# Patient Record
Sex: Male | Born: 1995 | Race: Asian | Hispanic: No | Marital: Single | Smoking: Current every day smoker
Health system: Southern US, Community
[De-identification: ages and names within clinical notes are randomized; demographics above are authoritative.]

---

## 2015-06-30 ENCOUNTER — Emergency Department (HOSPITAL_COMMUNITY): Payer: Self-pay

## 2015-06-30 ENCOUNTER — Encounter (HOSPITAL_COMMUNITY): Payer: Self-pay | Admitting: Family Medicine

## 2015-06-30 ENCOUNTER — Emergency Department (HOSPITAL_COMMUNITY)
Admission: EM | Admit: 2015-06-30 | Discharge: 2015-06-30 | Disposition: A | Discharge status: Home / Self Care | Payer: Self-pay | Attending: Emergency Medicine | Admitting: Emergency Medicine

## 2015-06-30 DIAGNOSIS — Z720 Tobacco use: Secondary | ICD-10-CM | POA: Insufficient documentation

## 2015-06-30 DIAGNOSIS — J209 Acute bronchitis, unspecified: Secondary | ICD-10-CM | POA: Insufficient documentation

## 2015-06-30 DIAGNOSIS — J40 Bronchitis, not specified as acute or chronic: Secondary | ICD-10-CM

## 2015-06-30 DIAGNOSIS — R000 Tachycardia, unspecified: Secondary | ICD-10-CM | POA: Insufficient documentation

## 2015-06-30 LAB — RAPID STREP SCREEN (MED CTR MEBANE ONLY): Streptococcus, Group A Screen (Direct): NEGATIVE

## 2015-06-30 MED ORDER — ACETAMINOPHEN 325 MG PO TABS
650.0000 mg | ORAL_TABLET | Freq: Once | ORAL | Status: AC | PRN
  Administered 2015-06-30: 650 mg via ORAL

## 2015-06-30 MED ORDER — AZITHROMYCIN 250 MG PO TABS: 250.0000 mg | ORAL_TABLET | Freq: Every day | ORAL | Status: AC

## 2015-06-30 MED ORDER — BENZONATATE 100 MG PO CAPS: 100.0000 mg | ORAL_CAPSULE | Freq: Three times a day (TID) | ORAL | Status: AC

## 2015-06-30 MED ORDER — ACETAMINOPHEN 325 MG PO TABS
ORAL_TABLET | ORAL | Status: AC
  Filled 2015-06-30: qty 2

## 2015-06-30 NOTE — ED Provider Notes (Signed)
CSN: [id number]     Arrival date & time 06/30/15  1528 History  This chart was scribed for Kerrie Buffalo, NP working with Gerhard Munch, MD by Evon Slack, ED Scribe. This patient was seen in room TR02C/TR02C and the patient's care was started at 4:17 PM.      Chief Complaint  Patient presents with  . Fever  . Sore Throat  . Cough   Patient is a 19 y.o. male presenting with cough. The history is provided by the patient. No language interpreter was used.  Cough Cough characteristics:  Productive Sputum characteristics:  Manson Passey Severity:  Moderate Onset quality:  Gradual Duration:  4 days Timing:  Intermittent Chronicity:  New Smoker: yes   Relieved by:  None tried Worsened by:  Nothing tried Ineffective treatments:  None tried Associated symptoms: chills, fever and sore throat    HPI Comments: Bela Nai Rodin is a 19 y.o. male who presents to the Emergency Department complaining of sore throat onset 4 days prior. Pt states that he has had associated congestion, fever, chills and productive cough of brown sputum. Pt also reports post tussive vomiting. Pt does report that he has been constipated the past week as well. Pt doesn't report any alleviating or worsening factors. Pt doesn't report nay medications PTA.  Pt does report smoking 4-5 cigarettes daily. Pt denies nausea or abdominal pain.    History reviewed. No pertinent past medical history. History reviewed. No pertinent past surgical history. History reviewed. No pertinent family history. Social History  Substance Use Topics  . Smoking status: Current Every Day Smoker  . Smokeless tobacco: None  . Alcohol Use: None    Review of Systems  Constitutional: Positive for fever and chills.  HENT: Positive for congestion and sore throat.   Respiratory: Positive for cough.   All other systems reviewed and are negative.    Allergies  Review of patient's allergies indicates no known allergies.  Home Medications   Prior to  Admission medications   Medication Sig Start Date End Date Taking? Authorizing Provider  azithromycin (ZITHROMAX) 250 MG tablet Take 1 tablet (250 mg total) by mouth daily. Take first 2 tablets together, then 1 every day until finished. 06/30/15   Hope Orlene Och, NP  benzonatate (TESSALON) 100 MG capsule Take 1 capsule (100 mg total) by mouth every 8 (eight) hours. 06/30/15   Hope Orlene Och, NP   BP 103/61 mmHg  Pulse 103  Temp(Src) 100 F (37.8 C) (Oral)  Resp 20  Ht  (1.651 m)  Wt 119 lb 5 oz (54.12 kg)  BMI 19.85 kg/m2  SpO2 100%   Physical Exam  Constitutional: He is oriented to person, place, and time. He appears well-developed and well-nourished. No distress.  HENT:  Head: Normocephalic and atraumatic.  Mouth/Throat: Uvula is midline. Posterior oropharyngeal erythema (mild) present. No posterior oropharyngeal edema.  Eyes: Conjunctivae and EOM are normal. Pupils are equal, round, and reactive to light.  Neck: Normal range of motion. Neck supple. No tracheal deviation present.  Cardiovascular: Regular rhythm.  Tachycardia present.   Pulmonary/Chest: Effort normal. No respiratory distress. He has no decreased breath sounds. He has no wheezes. He has rales in the right lower field.  Abdominal: There is no tenderness.  Musculoskeletal: Normal range of motion.  Lymphadenopathy:    He has no cervical adenopathy.  Neurological: He is alert and oriented to person, place, and time.  Skin: Skin is warm and dry.  Psychiatric: He has a normal mood  and affect. His behavior is normal.  Nursing note and vitals reviewed.   ED Course  Procedures (including critical care time) DIAGNOSTIC STUDIES: Oxygen Saturation is 100% on RA, normal by my interpretation.    COORDINATION OF CARE: 4:50 PM-Discussed treatment plan with pt at bedside and pt agreed to plan.     Labs Review Results for orders placed or performed during the hospital encounter of 06/30/15 (from the past 24 hour(s))  Rapid  strep screen (not at [facility])     Status: None   Collection Time: 06/30/15  4:02 PM  Result Value Ref Range   Streptococcus, Group A Screen (Direct) NEGATIVE NEGATIVE     Imaging Review Dg Chest 2 View  06/30/2015   CLINICAL DATA:  Cough and fever for 4 days.  Smoker.  EXAM: CHEST  2 VIEW  COMPARISON:  None.  FINDINGS: The lungs are clear. Heart size is normal. No pneumothorax or pleural fluid. No focal bony abnormality.  IMPRESSION: Negative chest.   Electronically Signed   By: Drusilla Kanner M.D.   On: 06/30/2015 17:37   I have personally reviewed and evaluated the lab results as part of my medical decision-making.   MDM  19 y.o. male with productive cough, sore throat, and is an everyday smoker. Stable for d/c without respiratory distress. O2 SAT 100% on R/A. Will treat with Z-Pak and Tessalon and encourage patient to stop smoking. Discussed with the patient and all questioned fully answered. He will return if any problems arise.   Final diagnoses:  Bronchitis    I personally performed the services described in this documentation, which was scribed in my presence. The recorded information has been reviewed and is accurate.      [address] [place], NP 06/30/15 1752  Gerhard Munch, MD 07/03/15 [phone]

## 2015-06-30 NOTE — ED Notes (Signed)
Pt here for sore throat, fever, and cough x a few days.

## 2015-07-02 ENCOUNTER — Emergency Department (HOSPITAL_COMMUNITY)
Admission: EM | Admit: 2015-07-02 | Discharge: 2015-07-02 | Disposition: A | Discharge status: Home / Self Care | Payer: Self-pay | Attending: Emergency Medicine | Admitting: Emergency Medicine

## 2015-07-02 ENCOUNTER — Encounter (HOSPITAL_COMMUNITY): Payer: Self-pay | Admitting: *Deleted

## 2015-07-02 DIAGNOSIS — J209 Acute bronchitis, unspecified: Secondary | ICD-10-CM

## 2015-07-02 DIAGNOSIS — R000 Tachycardia, unspecified: Secondary | ICD-10-CM | POA: Insufficient documentation

## 2015-07-02 DIAGNOSIS — J9801 Acute bronchospasm: Secondary | ICD-10-CM | POA: Insufficient documentation

## 2015-07-02 DIAGNOSIS — Z720 Tobacco use: Secondary | ICD-10-CM | POA: Insufficient documentation

## 2015-07-02 LAB — CULTURE, GROUP A STREP: Strep A Culture: NEGATIVE

## 2015-07-02 MED ORDER — IPRATROPIUM-ALBUTEROL 0.5-2.5 (3) MG/3ML IN SOLN
3.0000 mL | Freq: Once | RESPIRATORY_TRACT | Status: AC
  Administered 2015-07-02: 3 mL via RESPIRATORY_TRACT
  Filled 2015-07-02: qty 3

## 2015-07-02 MED ORDER — ALBUTEROL SULFATE HFA 108 (90 BASE) MCG/ACT IN AERS: 2.0000 | INHALATION_SPRAY | RESPIRATORY_TRACT | Status: DC | PRN

## 2015-07-02 MED ORDER — PREDNISONE 10 MG PO TABS: 20.0000 mg | ORAL_TABLET | Freq: Two times a day (BID) | ORAL | Status: AC

## 2015-07-02 NOTE — Discharge Instructions (Signed)
Cool Mist Vaporizers °Vaporizers may help relieve the symptoms of a cough and cold. They add moisture to the air, which helps mucus to become thinner and less sticky. This makes it easier to breathe and cough up secretions. Cool mist vaporizers do not cause serious burns like hot mist vaporizers, which may also be called steamers or humidifiers. Vaporizers have not been proven to help with colds. You should not use a vaporizer if you are allergic to mold. °HOME CARE INSTRUCTIONS °· Follow the package instructions for the vaporizer. °· Do not use anything other than distilled water in the vaporizer. °· Do not run the vaporizer all of the time. This can cause mold or bacteria to grow in the vaporizer. °· Clean the vaporizer after each time it is used. °· Clean and dry the vaporizer well before storing it. °· Stop using the vaporizer if worsening respiratory symptoms develop. °Document Released: 07/06/2004 Document Revised: 10/14/2013 Document Reviewed: 02/26/2013 °Exit[employer]® Patient Information ©2015 [employer]. This information is not intended to replace advice given to you by your health care provider. Make sure you discuss any questions you have with your health care provider. ° °

## 2015-07-02 NOTE — ED Provider Notes (Signed)
CSN: [id number]     Arrival date & time 07/02/15  1759 History  This chart was scribed for non-physician practitioner Kerrie Buffalo, NP working with Tilden Fossa, MD by Leone Payor, ED Scribe. This patient was seen in room TR05C/TR05C and the patient's care was started at 6:13 PM.    Chief Complaint  Patient presents with  . Letter for School/Work    The history is provided by the patient. No language interpreter was used.     HPI Comments: Mike Navarro is a 19 y.o. male who presents to the Emergency Department complaining of continued fatigued and cough for the past 2 days. He was seen on 06/30/15 when he was diagnosed with bronchitis and started on a Z-pak and Tessalon pearls. He reports feeling slightly better but continues to have a cough and feels chest tightness. He is also requesting a note for work because he states he is unable to get any rest while sleeping. He denies fever, chills.  Normal chest x-ray from previous visit.   History reviewed. No pertinent past medical history. History reviewed. No pertinent past surgical history. History reviewed. No pertinent family history. Social History  Substance Use Topics  . Smoking status: Current Every Day Smoker  . Smokeless tobacco: None  . Alcohol Use: No    Review of Systems  Constitutional: Negative for fever and chills.  Respiratory: Positive for cough and chest tightness.   all other systems negative    Allergies  Review of patient's allergies indicates no known allergies.  Home Medications   Prior to Admission medications   Medication Sig Start Date End Date Taking? Authorizing Provider  azithromycin (ZITHROMAX) 250 MG tablet Take 1 tablet (250 mg total) by mouth daily. Take first 2 tablets together, then 1 every day until finished. 06/30/15   Ravonda Brecheen Orlene Och, NP  benzonatate (TESSALON) 100 MG capsule Take 1 capsule (100 mg total) by mouth every 8 (eight) hours. 06/30/15   Kason Benak Orlene Och, NP  predniSONE (DELTASONE) 10 MG tablet Take  2 tablets (20 mg total) by mouth 2 (two) times daily with a meal. 07/02/15   Anmol Fleck Orlene Och, NP   BP 120/66 mmHg  Pulse 97  Temp(Src) 98.9 F (37.2 C) (Oral)  Resp 18  SpO2 100% Physical Exam  Constitutional: He is oriented to person, place, and time. He appears well-developed and well-nourished.  HENT:  Head: Normocephalic and atraumatic.  Mouth/Throat: Oropharynx is clear and moist. No oropharyngeal exudate.  Eyes: Conjunctivae are normal. Pupils are equal, round, and reactive to light.  Cardiovascular: Regular rhythm and normal heart sounds.  Tachycardia present.   Pulmonary/Chest: Effort normal. No respiratory distress. He has wheezes. He has no rales.  Slightly decreased breath sounds and expiratory wheezes bilaterally.   Abdominal: He exhibits no distension.  Neurological: He is alert and oriented to person, place, and time.  Skin: Skin is warm and dry.  Psychiatric: He has a normal mood and affect.  Nursing note and vitals reviewed.   ED Course  Procedures (including critical care time) Albuterol/Atrovent Neb treatment  DIAGNOSTIC STUDIES: Oxygen Saturation is 100% on RA, normal by my interpretation.    COORDINATION OF CARE: 6:15 PM Discussed treatment plan with pt at bedside and pt agreed to plan.  7:20 PM Improved breathing with Duoneb with an occasional wheeze. Will discharge home with an inhaler.     MDM  19 y.o. male returns to the ED for a work note and reports feeling of wheezing since previous  visit. Will treat for bronchitis with short course of steroid and albuterol inhaler. Discussed with the patient plan f care and all questioned fully answered. He will call me if any problems arise.  Final diagnoses:  Bronchitis with bronchospasm   I personally performed the services described in this documentation, which was scribed in my presence. The recorded information has been reviewed and is accurate.    [address] [place], NP 07/02/15 2324  Tilden Fossa, MD 07/03/15  [phone]

## 2015-07-02 NOTE — ED Notes (Signed)
Pt was seen on 9/7 for bronchitis and still has fatigue and cough, unable to work and needs a work note. No distress noted.

## 2016-07-25 IMAGING — CR DG CHEST 2V
2 series · 2 of 2 positions shown · non-contrast
Comparison: None.

CLINICAL DATA: Cough and fever for 4 days.  Smoker.

EXAM:
CHEST  2 VIEW

[chest pa]
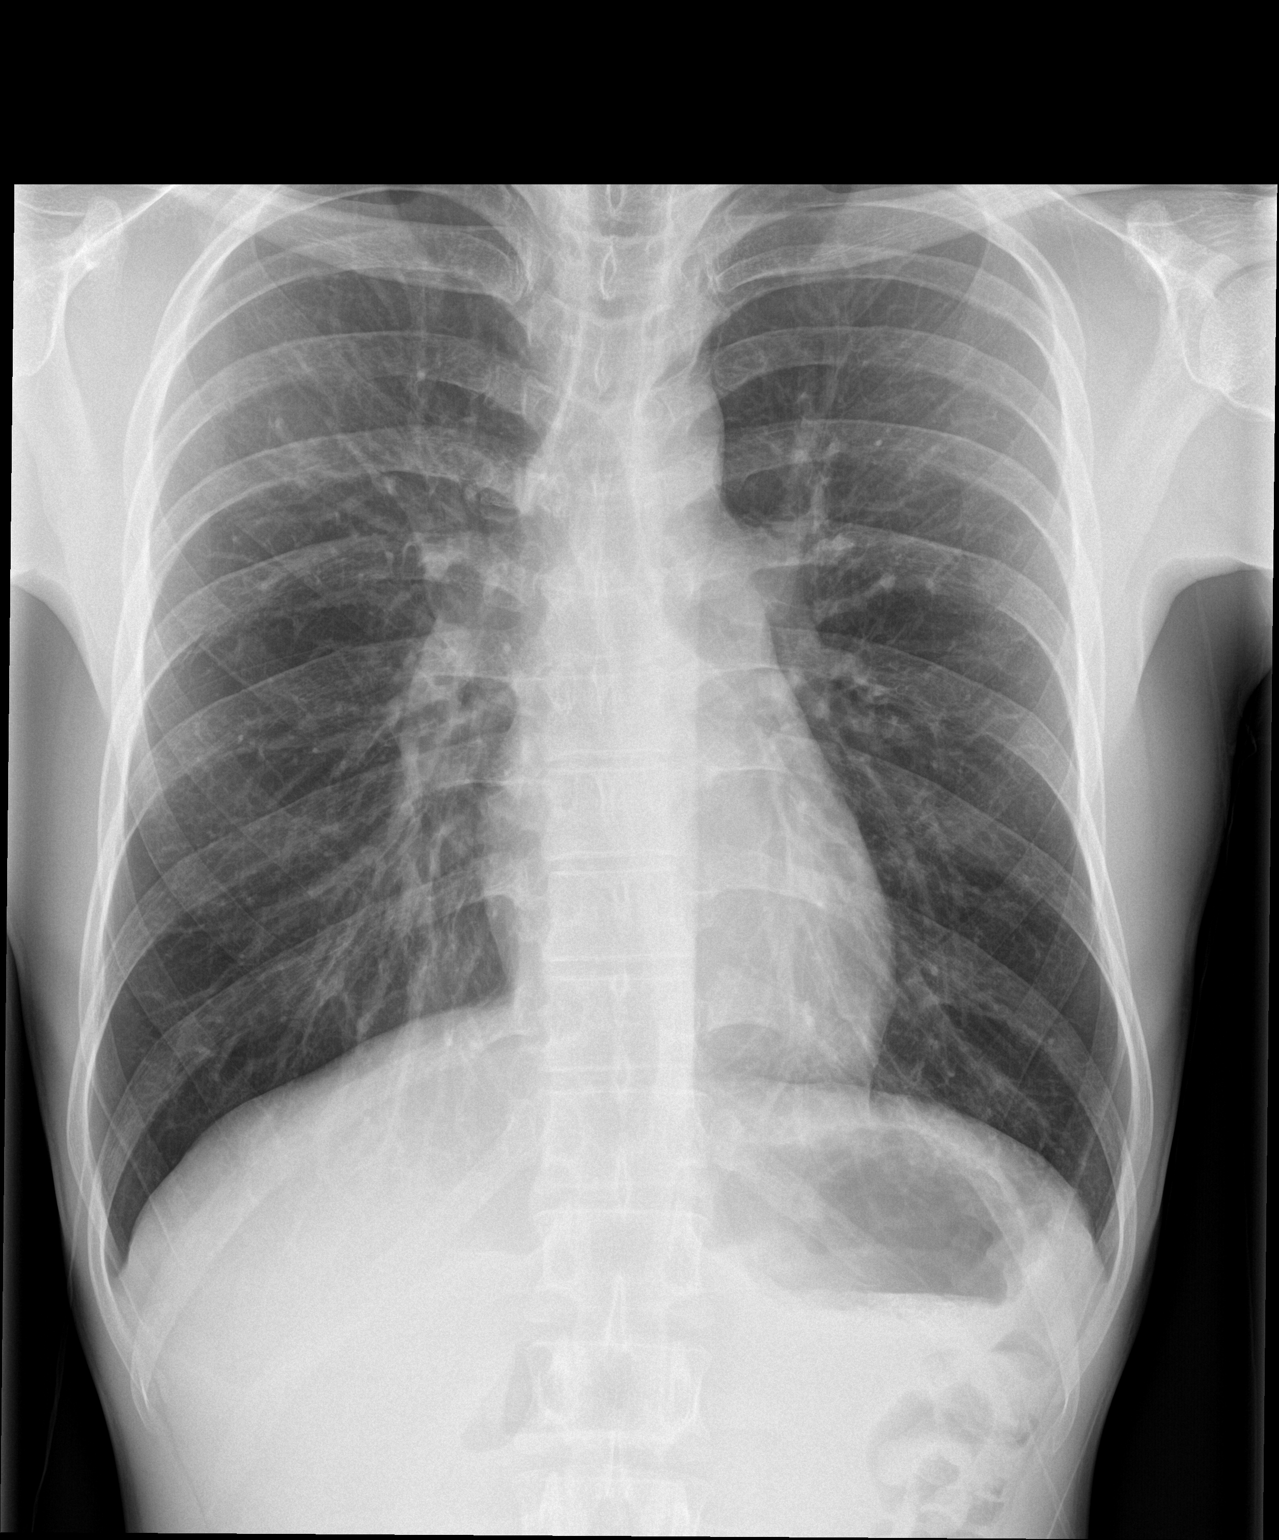

[chest lat]
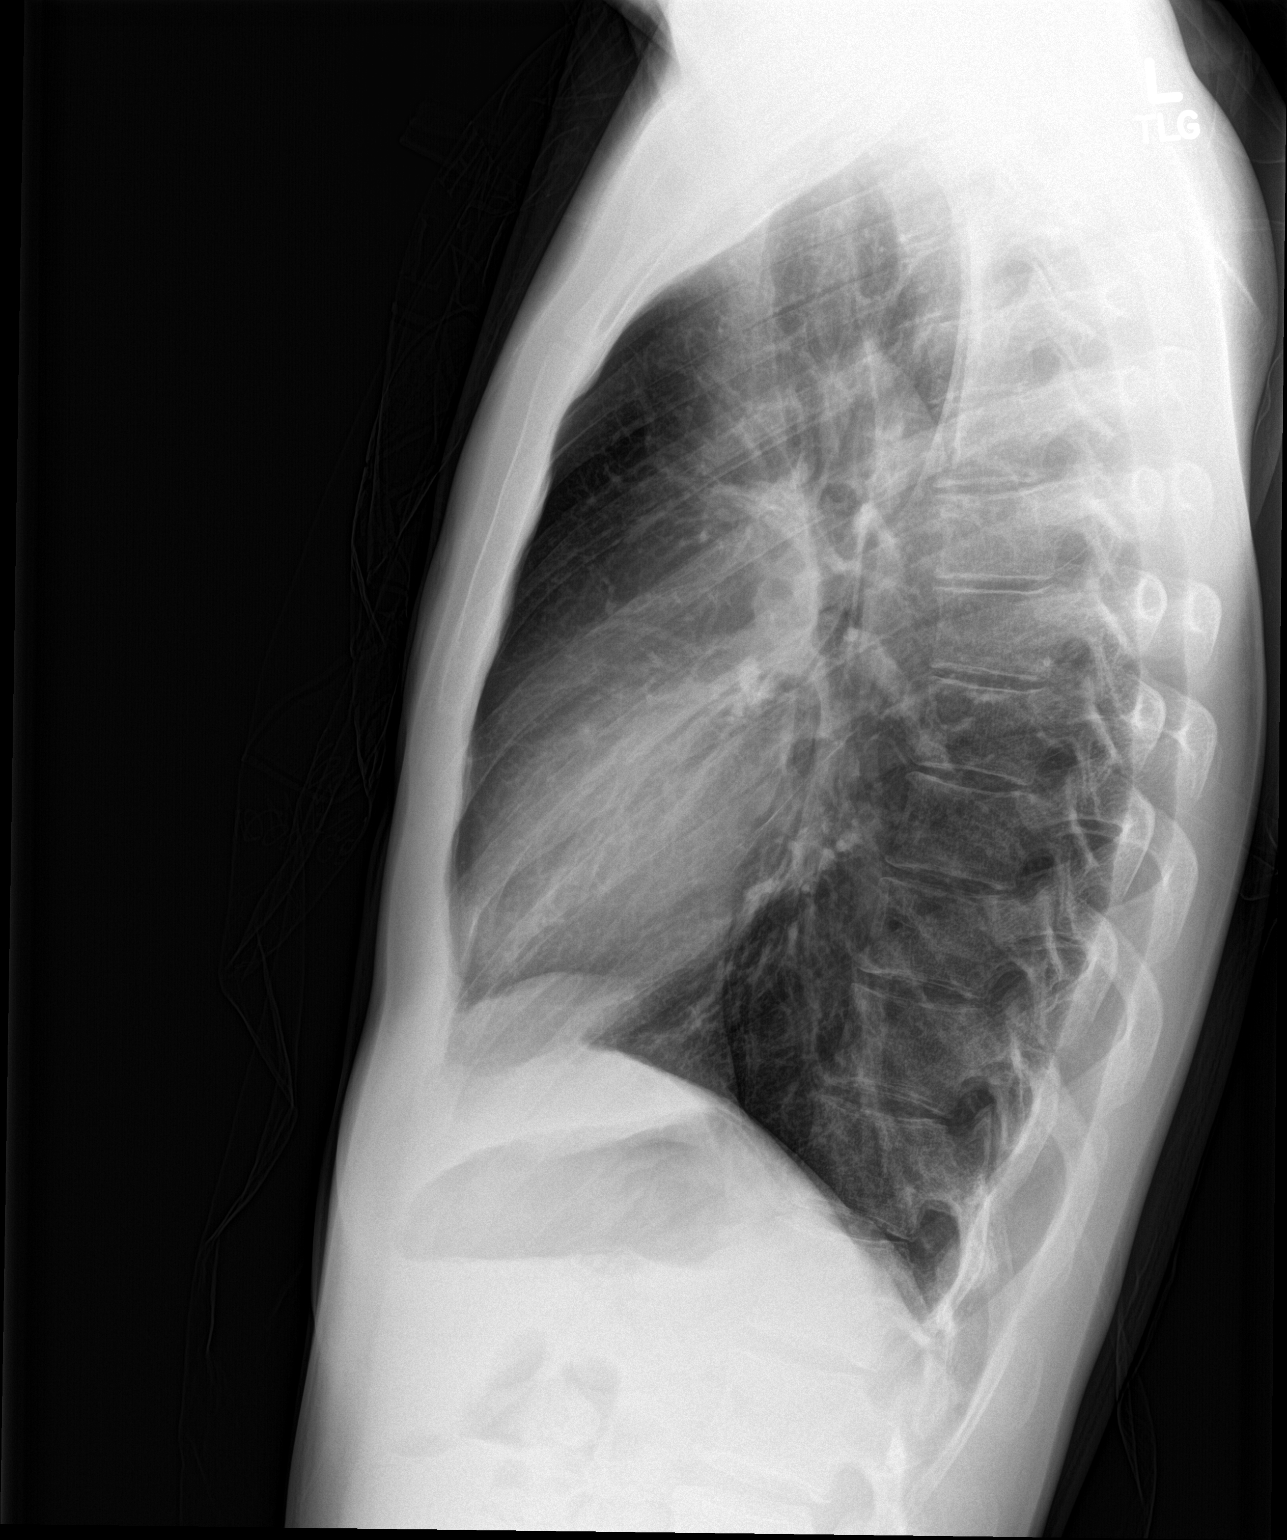

[2 of 2 positions shown; findings below may reference images not displayed]

FINDINGS: The lungs are clear. Heart size is normal. No pneumothorax or
pleural fluid. No focal bony abnormality.
IMPRESSION: Negative chest.

## 2019-11-26 ENCOUNTER — Ambulatory Visit: Payer: Self-pay | Attending: Internal Medicine

## 2019-11-26 DIAGNOSIS — Z20822 Contact with and (suspected) exposure to covid-19: Secondary | ICD-10-CM | POA: Insufficient documentation

## 2019-11-27 ENCOUNTER — Other Ambulatory Visit: Payer: Self-pay

## 2019-11-27 LAB — NOVEL CORONAVIRUS, NAA: SARS-CoV-2, NAA: NOT DETECTED

## 2020-05-01 ENCOUNTER — Ambulatory Visit: Payer: Self-pay

## 2020-05-01 ENCOUNTER — Ambulatory Visit: Payer: Self-pay | Attending: Internal Medicine

## 2020-05-01 DIAGNOSIS — Z23 Encounter for immunization: Secondary | ICD-10-CM

## 2020-05-01 NOTE — Progress Notes (Signed)
   Covid-19 Vaccination Clinic  Name:  Mike Navarro    MRN: [id number] DOB: 06/22/1996  05/01/2020  [name] was observed post Covid-19 immunization for 15 minutes without incident. He was provided with Vaccine Information Sheet and instruction to access the V-Safe system.   [name] was instructed to call 911 with any severe reactions post vaccine: Marland Kitchen Difficulty breathing  . Swelling of face and throat  . A fast heartbeat  . A bad rash all over body  . Dizziness and weakness   Immunizations Administered    Name Date Dose VIS Date Route   JANSSEN COVID-19 VACCINE 05/01/2020  9:37 AM 0.5 mL 12/20/2019 Intramuscular   Manufacturer: [employer]   Lot: [id number]   NDC: 56979-480-16
